# Patient Record
Sex: Male | Born: 1985 | Race: White | Hispanic: No | Marital: Single | Smoking: Never smoker
Health system: Southern US, Community
[De-identification: ages and names within clinical notes are randomized; demographics above are authoritative.]

---
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
## 2008-03-27 ENCOUNTER — Emergency Department (HOSPITAL_COMMUNITY): Admission: EM | Admit: 2008-03-27 | Discharge: 2008-03-27 | Payer: Self-pay | Admitting: Emergency Medicine

## 2010-06-23 LAB — POCT I-STAT, CHEM 8
BUN: 20 mg/dL (ref 6–23)
Hemoglobin: 17.3 g/dL — ABNORMAL HIGH (ref 13.0–17.0)
Potassium: 4.1 mEq/L (ref 3.5–5.1)
Sodium: 139 mEq/L (ref 135–145)
TCO2: 23 mmol/L (ref 0–100)

## 2012-05-18 ENCOUNTER — Emergency Department (HOSPITAL_COMMUNITY)
Admission: EM | Admit: 2012-05-18 | Discharge: 2012-05-18 | Disposition: A | Discharge status: Home / Self Care | Payer: No Typology Code available for payment source | Attending: Emergency Medicine | Admitting: Emergency Medicine

## 2012-05-18 ENCOUNTER — Emergency Department (HOSPITAL_COMMUNITY): Payer: No Typology Code available for payment source

## 2012-05-18 DIAGNOSIS — S8990XA Unspecified injury of unspecified lower leg, initial encounter: Secondary | ICD-10-CM | POA: Insufficient documentation

## 2012-05-18 DIAGNOSIS — Y9355 Activity, bike riding: Secondary | ICD-10-CM | POA: Insufficient documentation

## 2012-05-18 DIAGNOSIS — Y92410 Unspecified street and highway as the place of occurrence of the external cause: Secondary | ICD-10-CM | POA: Insufficient documentation

## 2012-05-18 MED ORDER — TETANUS-DIPHTH-ACELL PERTUSSIS 5-2.5-18.5 LF-MCG/0.5 IM SUSP
0.5000 mL | Freq: Once | INTRAMUSCULAR | Status: AC
  Administered 2012-05-18: 0.5 mL via INTRAMUSCULAR
  Filled 2012-05-18 (×2): qty 0.5

## 2012-05-18 MED ORDER — OXYCODONE-ACETAMINOPHEN 5-325 MG PO TABS: ORAL_TABLET | ORAL | Status: DC

## 2012-05-18 MED ORDER — ACETAMINOPHEN 325 MG PO TABS
650.0000 mg | ORAL_TABLET | Freq: Once | ORAL | Status: AC
Start: 2012-05-18 — End: 2012-05-18
  Administered 2012-05-18: 650 mg via ORAL
  Filled 2012-05-18: qty 2
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
## 2012-05-18 NOTE — ED Notes (Signed)
Patient transported to X-ray 
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
## 2012-05-23 NOTE — ED Provider Notes (Signed)
Medical screening examination/treatment/procedure(s) were performed by non-physician practitioner and as supervising physician I was immediately available for consultation/collaboration.  Gilda Crease, MD 05/23/12 2350

## 2015-04-04 ENCOUNTER — Ambulatory Visit (INDEPENDENT_AMBULATORY_CARE_PROVIDER_SITE_OTHER): Payer: Self-pay

## 2015-04-04 ENCOUNTER — Ambulatory Visit (INDEPENDENT_AMBULATORY_CARE_PROVIDER_SITE_OTHER): Payer: Self-pay | Admitting: Physician Assistant

## 2015-04-04 VITALS — BP 118/80 | HR 103 | Temp 99.5°F | Resp 18 | Ht 70.0 in | Wt 184.0 lb

## 2015-04-04 DIAGNOSIS — R05 Cough: Secondary | ICD-10-CM

## 2015-04-04 DIAGNOSIS — R059 Cough, unspecified: Secondary | ICD-10-CM

## 2015-04-04 DIAGNOSIS — R7989 Other specified abnormal findings of blood chemistry: Secondary | ICD-10-CM

## 2015-04-04 LAB — POCT CBC
Granulocyte percent: 74.6 %G (ref 37–80)
HCT, POC: 45.5 % (ref 43.5–53.7)
HEMOGLOBIN: 15.6 g/dL (ref 14.1–18.1)
Lymph, poc: 0.6 (ref 0.6–3.4)
MCH: 30.6 pg (ref 27–31.2)
MCHC: 34.2 g/dL (ref 31.8–35.4)
MCV: 89.4 fL (ref 80–97)
MID (cbc): 0.2 (ref 0–0.9)
MPV: 8.5 fL (ref 0–99.8)
PLATELET COUNT, POC: 97 10*3/uL — AB (ref 142–424)
POC Granulocyte: 5.4 (ref 2–6.9)
POC LYMPH PERCENT: 17.9 %L (ref 10–50)
POC MID %: 7.5 %M (ref 0–12)
RBC: 5.09 M/uL (ref 4.69–6.13)
RDW, POC: 12.4 %
WBC: 3.2 10*3/uL — AB (ref 4.6–10.2)

## 2015-04-04 NOTE — Patient Instructions (Addendum)
Because you received an x-ray today, you will receive an invoice from [facility]. Please contact [facility] at [phone] with questions or concerns regarding your invoice. Our billing staff will not be able to assist you with those questions.  Ibuprofen/tylenol for the pain.  You can take ibuprofen  every 6 hours.   Get plenty of rest. Gargle salt water.   Upper Respiratory Infection, Adult Most upper respiratory infections (URIs) are a viral infection of the air passages leading to the lungs. A URI affects the nose, throat, and upper air passages. The most common type of URI is nasopharyngitis and is typically referred to as "the common cold." URIs run their course and usually go away on their own. Most of the time, a URI does not require medical attention, but sometimes a bacterial infection in the upper airways can follow a viral infection. This is called a secondary infection. Sinus and middle ear infections are common types of secondary upper respiratory infections. Bacterial pneumonia can also complicate a URI. A URI can worsen asthma and chronic obstructive pulmonary disease (COPD). Sometimes, these complications can require emergency medical care and may be life threatening.  CAUSES Almost all URIs are caused by viruses. A virus is a type of germ and can spread from one person to another.  RISKS FACTORS You may be at risk for a URI if:   You smoke.   You have chronic heart or lung disease.  You have a weakened defense (immune) system.   You are very young or very old.   You have nasal allergies or asthma.  You work in crowded or poorly ventilated areas.  You work in health care facilities or schools. SIGNS AND SYMPTOMS  Symptoms typically develop 2-3 days after you come in contact with a cold virus. Most viral URIs last 7-10 days. However, viral URIs from the influenza virus (flu virus) can last 14-18 days and are typically more severe. Symptoms may  include:   Runny or stuffy (congested) nose.   Sneezing.   Cough.   Sore throat.   Headache.   Fatigue.   Fever.   Loss of appetite.   Pain in your forehead, behind your eyes, and over your cheekbones (sinus pain).  Muscle aches.  DIAGNOSIS  Your health care provider may diagnose a URI by:  Physical exam.  Tests to check that your symptoms are not due to another condition such as:  Strep throat.  Sinusitis.  Pneumonia.  Asthma. TREATMENT  A URI goes away on its own with time. It cannot be cured with medicines, but medicines may be prescribed or recommended to relieve symptoms. Medicines may help:  Reduce your fever.  Reduce your cough.  Relieve nasal congestion. HOME CARE INSTRUCTIONS   Take medicines only as directed by your health care provider.   Gargle warm saltwater or take cough drops to comfort your throat as directed by your health care provider.  Use a warm mist humidifier or inhale steam from a shower to increase air moisture. This may make it easier to breathe.  Drink enough fluid to keep your urine clear or pale yellow.   Eat soups and other clear broths and maintain good nutrition.   Rest as needed.   Return to work when your temperature has returned to normal or as your health care provider advises. You may need to stay home longer to avoid infecting others. You can also use a face mask and careful hand washing to prevent spread of the  virus.  Increase the usage of your inhaler if you have asthma.   Do not use any tobacco products, including cigarettes, chewing tobacco, or electronic cigarettes. If you need help quitting, ask your health care provider. PREVENTION  The best way to protect yourself from getting a cold is to practice good hygiene.   Avoid oral or hand contact with people with cold symptoms.   Wash your hands often if contact occurs.  There is no clear evidence that vitamin C, vitamin E, echinacea, or  exercise reduces the chance of developing a cold. However, it is always recommended to get plenty of rest, exercise, and practice good nutrition.  SEEK MEDICAL CARE IF:   You are getting worse rather than better.   Your symptoms are not controlled by medicine.   You have chills.  You have worsening shortness of breath.  You have brown or red mucus.  You have yellow or brown nasal discharge.  You have pain in your face, especially when you bend forward.  You have a fever.  You have swollen neck glands.  You have pain while swallowing.  You have white areas in the back of your throat. SEEK IMMEDIATE MEDICAL CARE IF:   You have severe or persistent:  Headache.  Ear pain.  Sinus pain.  Chest pain.  You have chronic lung disease and any of the following:  Wheezing.  Prolonged cough.  Coughing up blood.  A change in your usual mucus.  You have a stiff neck.  You have changes in your:  Vision.  Hearing.  Thinking.  Mood. MAKE SURE YOU:   Understand these instructions.  Will watch your condition.  Will get help right away if you are not doing well or get worse.   This information is not intended to replace advice given to you by your health care provider. Make sure you discuss any questions you have with your health care provider.   Document Released: 08/19/2000 Document Revised: 07/10/2014 Document Reviewed: 05/31/2013 Elsevier Interactive Patient Education [employer]

## 2015-04-04 NOTE — Progress Notes (Signed)
[facility] [address], [place] [address] [phone]  Date:  04/04/2015   Name:  Joshua Jennings   DOB:  1985/10/04   MRN:  [id number]  PCP:  Pcp Not In System    History of Present Illness:  Joshua Jennings is a 30 y.o. male patient who presents to [facility] for cc of cough. Patient reports that 2 days ago his sxs began with coughing that progressively worsened in the night.  He started taking mucinex for this which helped resolve the coughing.  He is left with body aches, sore throat, and mild nasal congestion.  Mild bilateral otaliga with swallowing, body aches, and sore throat, mild congestion.  Subjective fever and chills.  No nausea, abdominal pain, or fever.  He has taken mucinex, aleve, and ibuprofen.  He has not had a flu vaccine this year.  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  He denies sob or dyspnea.   There are no active problems to display for this patient.   History reviewed. No pertinent past medical history.  History reviewed. No pertinent past surgical history.  Social History  Substance Use Topics  . Smoking status: Never Smoker   . Smokeless tobacco: None  . Alcohol Use: No    History reviewed. No pertinent family history.  No Known Allergies  Medication list has been reviewed and updated.  Current Outpatient Prescriptions on File Prior to Visit  Medication Sig Dispense Refill  . Multiple Vitamin (MULTIVITAMIN WITH MINERALS) TABS Take 1 tablet by mouth daily. Reported on 04/04/2015    . oxyCODONE-acetaminophen (PERCOCET/ROXICET) 5-325 MG per tablet 1 to 2 tabs PO q6hrs  PRN for pain (Patient not taking: Reported on 04/04/2015) 15 tablet 0   No current facility-administered medications on file prior to visit.    ROS ROS otherwise unremarkable unless listed above.   Physical Examination: BP 118/80 mmHg  Pulse 103  Temp(Src) 99.5  F (37.5 C) (Oral)  Resp 18  Ht  (1.778 m)  Wt 184 lb (83.462 kg)  BMI 26.40 kg/m2  SpO2 97% Ideal Body Weight: Weight in (lb) to have BMI = 25: 173.9  Physical Exam  Constitutional: He is oriented to person, place, and time. He appears well-developed and well-nourished. No distress.  HENT:  Head: Normocephalic and atraumatic.  Right Ear: Tympanic membrane, external ear and ear canal normal.  Left Ear: Tympanic membrane, external ear and ear canal normal.  Nose: Mucosal edema and rhinorrhea (clear mucus) present. Right sinus exhibits no maxillary sinus tenderness and no frontal sinus tenderness. Left sinus exhibits no maxillary sinus tenderness and no frontal sinus tenderness.  Mouth/Throat: No uvula swelling. Posterior oropharyngeal erythema (minimal) present. No oropharyngeal exudate or posterior oropharyngeal edema.  Eyes: Conjunctivae, EOM and lids are normal. Pupils are equal, round, and reactive to light. Right eye exhibits normal extraocular motion. Left eye exhibits normal extraocular motion.  Neck: Trachea normal and full passive range of motion without pain. No edema and no erythema present.  Cardiovascular: Normal rate and regular rhythm.  Exam reveals no gallop and no friction rub.   No murmur heard. Pulmonary/Chest: Effort normal. No respiratory distress. He has no decreased breath sounds. He has no wheezes. He has no rhonchi.  Faint crackling at the lll.  Lymphadenopathy:       Head (right side): Tonsillar adenopathy present. No submental, no  submandibular, no preauricular and no posterior auricular adenopathy present.       Head (left side): No submental, no submandibular, no tonsillar, no preauricular and no posterior auricular adenopathy present.    He has no cervical adenopathy.  Neurological: He is alert and oriented to person, place, and time.  Skin: Skin is warm and dry. He is not diaphoretic.  Psychiatric: He has a normal mood and affect. His behavior is  normal.    Results for orders placed or performed in visit on 04/04/15  POCT CBC  Result Value Ref Range   WBC 3.2 (A) 4.6 - 10.2 K/uL   Lymph, poc 0.6 0.6 - 3.4   POC LYMPH PERCENT 17.9 10 - 50 %L   MID (cbc) 0.2 0 - 0.9   POC MID % 7.5 0 - 12 %M   POC Granulocyte 5.4 2 - 6.9   Granulocyte percent 74.6 37 - 80 %G   RBC 5.09 4.69 - 6.13 M/uL   Hemoglobin 15.6 14.1 - 18.1 g/dL   HCT, POC 16.1 09.6 - 53.7 %   MCV 89.4 80 - 97 fL   MCH, POC 30.6 27 - 31.2 pg   MCHC 34.2 31.8 - 35.4 g/dL   RDW, POC 04.5 %   Platelet Count, POC 97 (A) 142 - 424 K/uL   MPV 8.5 0 - 99.8 fL   Orthostatic VS for the past 24 hrs (Last 3 readings):  BP- Lying Pulse- Lying BP- Sitting Pulse- Sitting BP- Standing at 0 minutes Pulse- Standing at 0 minutes  04/04/15 1332 123/79 mmHg 93 131/79 mmHg 105 116/77 mmHg 109  Dg Chest 2 View  04/04/2015  CLINICAL DATA:  Cough. EXAM: CHEST  2 VIEW COMPARISON:  05/18/2012. FINDINGS: Mediastinum hilar structures normal. Lungs are clear. Heart size normal. No pleural effusion or pneumothorax. No acute bony abnormality. IMPRESSION: No acute cardiopulmonary disease. Electronically Signed   By: Maisie Fus  Register   On: 04/04/2015 14:45   Assessment and Plan: Joshua Jennings is a 30 y.o. male who is here today for chief complaint of nasal congestion, body aches, sore throat, cough, and fatigue.   Likely viral--and this could be flu.  i have advised heavy hydration.  Continued use of mucinex, and tylenol/ibuprofen for pain/fever.  Patient voiced understanding.  He will also return in 1-2 weeks for recheck of his platelets.    Cough - Plan: POCT CBC, DG Chest 2 View  Abnormal complete blood count - Plan: CBC with Differential/Platelet  Trena Platt, PA-C [facility] 04/04/2015 1:17 PM

## 2015-04-07 ENCOUNTER — Encounter: Payer: Self-pay | Admitting: Physician Assistant

## 2015-05-10 ENCOUNTER — Encounter: Payer: Self-pay | Admitting: Internal Medicine

## 2015-05-10 MED ORDER — DOXYCYCLINE HYCLATE 100 MG PO TABS: 100.0000 mg | ORAL_TABLET | Freq: Two times a day (BID) | ORAL | Status: DC

## 2015-05-13 NOTE — Progress Notes (Signed)
Left without being seen.   This encounter was created in error - please disregard.

## 2015-05-14 NOTE — Progress Notes (Signed)
I have cancelled the doxy Rx at pharm.

## 2015-11-14 ENCOUNTER — Ambulatory Visit (INDEPENDENT_AMBULATORY_CARE_PROVIDER_SITE_OTHER): Payer: Self-pay | Admitting: Urgent Care

## 2015-11-14 VITALS — BP 110/70 | HR 77 | Temp 98.1°F | Resp 18 | Ht 70.5 in | Wt 168.0 lb

## 2015-11-14 DIAGNOSIS — R197 Diarrhea, unspecified: Secondary | ICD-10-CM

## 2015-11-14 DIAGNOSIS — R112 Nausea with vomiting, unspecified: Secondary | ICD-10-CM

## 2015-11-14 DIAGNOSIS — R109 Unspecified abdominal pain: Secondary | ICD-10-CM

## 2015-11-14 LAB — POCT CBC
GRANULOCYTE PERCENT: 76.6 % (ref 37–80)
HCT, POC: 37.3 % — AB (ref 43.5–53.7)
Hemoglobin: 12.8 g/dL — AB (ref 14.1–18.1)
Lymph, poc: 1.7 (ref 0.6–3.4)
MCH, POC: 29.5 pg (ref 27–31.2)
MCHC: 34.4 g/dL (ref 31.8–35.4)
MCV: 85.9 fL (ref 80–97)
MID (cbc): 0.8 (ref 0–0.9)
MPV: 7.4 fL (ref 0–99.8)
PLATELET COUNT, POC: 242 10*3/uL (ref 142–424)
POC Granulocyte: 8.3 — AB (ref 2–6.9)
POC LYMPH %: 16 % (ref 10–50)
POC MID %: 7.4 %M (ref 0–12)
RBC: 4.34 M/uL — AB (ref 4.69–6.13)
RDW, POC: 11.9 %
WBC: 10.9 10*3/uL — AB (ref 4.6–10.2)

## 2015-11-14 LAB — POCT URINALYSIS DIP (MANUAL ENTRY)
BILIRUBIN UA: NEGATIVE
BILIRUBIN UA: NEGATIVE
Glucose, UA: NEGATIVE
LEUKOCYTES UA: NEGATIVE
Nitrite, UA: NEGATIVE
PH UA: 6
SPEC GRAV UA: 1.015
Urobilinogen, UA: 0.2

## 2015-11-14 NOTE — Patient Instructions (Addendum)
Diarrhea Diarrhea is frequent loose and watery bowel movements. It can cause you to feel weak and dehydrated. Dehydration can cause you to become tired and thirsty, have a dry mouth, and have decreased urination that often is dark yellow. Diarrhea is a sign of another problem, most often an infection that will not last long. In most cases, diarrhea typically lasts 2-3 days. However, it can last longer if it is a sign of something more serious. It is important to treat your diarrhea as directed by your caregiver to lessen or prevent future episodes of diarrhea. CAUSES  Some common causes include:  Gastrointestinal infections caused by viruses, bacteria, or parasites.  Food poisoning or food allergies.  Certain medicines, such as antibiotics, chemotherapy, and laxatives.  Artificial sweeteners and fructose.  Digestive disorders. HOME CARE INSTRUCTIONS  Ensure adequate fluid intake (hydration): Have 1 cup (8 oz) of fluid for each diarrhea episode. Avoid fluids that contain simple sugars or sports drinks, fruit juices, whole milk products, and sodas. Your urine should be clear or pale yellow if you are drinking enough fluids. Hydrate with an oral rehydration solution that you can purchase at pharmacies, retail stores, and online. You can prepare an oral rehydration solution at home by mixing the following ingredients together:   - tsp table salt.   tsp baking soda.   tsp salt substitute containing potassium chloride.  1  tablespoons sugar.  1 L (34 oz) of water.  Certain foods and beverages may increase the speed at which food moves through the gastrointestinal (GI) tract. These foods and beverages should be avoided and include:  Caffeinated and alcoholic beverages.  High-fiber foods, such as raw fruits and vegetables, nuts, seeds, and whole grain breads and cereals.  Foods and beverages sweetened with sugar alcohols, such as xylitol, sorbitol, and mannitol.  Some foods may be well  tolerated and may help thicken stool including:  Starchy foods, such as rice, toast, pasta, low-sugar cereal, oatmeal, grits, baked potatoes, crackers, and bagels.  Bananas.  Applesauce.  Add probiotic-rich foods to help increase healthy bacteria in the GI tract, such as yogurt and fermented milk products.  Wash your hands well after each diarrhea episode.  Only take over-the-counter or prescription medicines as directed by your caregiver.  Take a warm bath to relieve any burning or pain from frequent diarrhea episodes. SEEK IMMEDIATE MEDICAL CARE IF:   You are unable to keep fluids down.  You have persistent vomiting.  You have blood in your stool, or your stools are black and tarry.  You do not urinate in 6-8 hours, or there is only a small amount of very dark urine.  You have abdominal pain that increases or localizes.  You have weakness, dizziness, confusion, or light-headedness.  You have a severe headache.  Your diarrhea gets worse or does not get better.  You have a fever or persistent symptoms for more than 2-3 days.  You have a fever and your symptoms suddenly get worse. MAKE SURE YOU:   Understand these instructions.  Will watch your condition.  Will get help right away if you are not doing well or get worse.   This information is not intended to replace advice given to you by your health care provider. Make sure you discuss any questions you have with your health care provider.   Document Released: 02/13/2002 Document Revised: 03/16/2014 Document Reviewed: 11/01/2011 Elsevier Interactive Patient Education 2016 [employer] Inc[employer]     IF you received an x-ray today, you will   receive an invoice from [facility]. Please contact [facility] at [phone] with questions or concerns regarding your invoice.   IF you received labwork today, you will receive an invoice from [facility]. Please contact [facility] at  [phone] with questions or concerns regarding your invoice.   Our billing staff will not be able to assist you with questions regarding bills from these companies.  You will be contacted with the lab results as soon as they are available. The fastest way to get your results is to activate your My Chart account. Instructions are located on the last page of this paperwork. If you have not heard from us regarding the results in 2 weeks, please contact this office.      

## 2015-11-14 NOTE — Progress Notes (Signed)
MRN: [id number] DOB: 1985/12/12  Subjective:   Joshua Jennings is a 30 y.o. male presenting for chief complaint of Other (Possible stomach virus for the past 12 days. Diarrhea, flu like symptoms. )  Reports 2 week history of diarrhea. He has been having 10 bowel movements per day. Has also had nausea and vomiting (2 episodes), belly cramping, chills, subjective fever. There is slight improvement in nausea and vomiting. He did start to eat more fiber, has hydrated well over the past 5 days. He still has diarrhea but is 2-3 times daily. He has had episodes of this in the past few years, symptoms have resolved on their own. He has never seen a GI specialist. Denies eating uncooked food, drinking unfiltered water, bloody stools. Patient has generally kept his normal eating routine.  Joshua Jennings has a current medication list which includes the following prescription(s): multivitamin with minerals and oxycodone-acetaminophen. Also has No Known Allergies.  Joshua Jennings  has no past medical history on file. Also  has no past surgical history on file.  Objective:   Vitals: BP 110/70   Pulse 77   Temp 98.1 F (36.7 C) (Oral)   Resp 18   Ht 5' 10.5" (1.791 m)   Wt 168 lb (76.2 kg)   SpO2 100%   BMI 23.76 kg/m   Physical Exam  Constitutional: He is oriented to person, place, and time. He appears well-developed and well-nourished.  HENT:  Mouth/Throat: Oropharynx is clear and moist.  Cardiovascular: Normal rate, regular rhythm and intact distal pulses.  Exam reveals no gallop and no friction rub.   No murmur heard. Pulmonary/Chest: No respiratory distress. He has no wheezes. He has no rales.  Abdominal: Soft. Bowel sounds are normal. He exhibits no distension and no mass. There is no guarding.  Neurological: He is alert and oriented to person, place, and time.  Skin: Skin is warm and dry.   Results for orders placed or performed in visit on 11/14/15 (from the past 24 hour(s))  POCT urinalysis  dipstick     Status: Abnormal   Collection Time: 11/14/15 12:53 PM  Result Value Ref Range   Color, UA yellow yellow   Clarity, UA clear clear   Glucose, UA negative negative   Bilirubin, UA negative negative   Ketones, POC UA negative negative   Spec Grav, UA 1.015    Blood, UA small (A) negative   pH, UA 6.0    Protein Ur, POC trace (A) negative   Urobilinogen, UA 0.2    Nitrite, UA Negative Negative   Leukocytes, UA Negative Negative  POCT CBC     Status: Abnormal   Collection Time: 11/14/15  1:03 PM  Result Value Ref Range   WBC 10.9 (A) 4.6 - 10.2 K/uL   Lymph, poc 1.7 0.6 - 3.4   POC LYMPH PERCENT 16.0 10 - 50 %L   MID (cbc) 0.8 0 - 0.9   POC MID % 7.4 0 - 12 %M   POC Granulocyte 8.3 (A) 2 - 6.9   Granulocyte percent 76.6 37 - 80 %G   RBC 4.34 (A) 4.69 - 6.13 M/uL   Hemoglobin 12.8 (A) 14.1 - 18.1 g/dL   HCT, POC 40.9 (A) 81.1 - 53.7 %   MCV 85.9 80 - 97 fL   MCH, POC 29.5 27 - 31.2 pg   MCHC 34.4 31.8 - 35.4 g/dL   RDW, POC 91.4 %   Platelet Count, POC 242 142 - 424 K/uL   MPV  7.4 0 - 99.8 fL   Assessment and Plan :   1. Diarrhea, unspecified type 2. Nausea and vomiting, intractability of vomiting not specified, unspecified vomiting type 3. Abdominal cramping - GI panel is pending, I do suspect an infectious etiology. I advised patient start supportive care. Counseled on signs and symptoms warranting more emergent treatment.  Joshua Papillion, PA-C [facility] [phone] 11/14/2015 12:30 PM

## 2015-11-15 LAB — GASTROINTESTINAL PATHOGEN PANEL PCR
C. difficile Tox A/B, PCR: NOT DETECTED
CAMPYLOBACTER, PCR: NOT DETECTED
Cryptosporidium, PCR: NOT DETECTED
E coli (ETEC) LT/ST PCR: NOT DETECTED
E coli (STEC) stx1/stx2, PCR: NOT DETECTED
E coli 0157, PCR: NOT DETECTED
GIARDIA LAMBLIA, PCR: NOT DETECTED
NOROVIRUS, PCR: NOT DETECTED
ROTAVIRUS, PCR: NOT DETECTED
SALMONELLA, PCR: NOT DETECTED
SHIGELLA, PCR: NOT DETECTED

## 2015-11-18 ENCOUNTER — Telehealth: Payer: Self-pay

## 2015-11-18 NOTE — Telephone Encounter (Signed)
Patient would like a phone call once labs have been reviewed. [phone]

## 2015-11-20 NOTE — Telephone Encounter (Signed)
Already done

## 2015-11-21 ENCOUNTER — Other Ambulatory Visit (INDEPENDENT_AMBULATORY_CARE_PROVIDER_SITE_OTHER): Payer: Self-pay

## 2015-11-21 ENCOUNTER — Ambulatory Visit (INDEPENDENT_AMBULATORY_CARE_PROVIDER_SITE_OTHER): Payer: Self-pay | Admitting: Internal Medicine

## 2015-11-21 ENCOUNTER — Encounter: Payer: Self-pay | Admitting: Internal Medicine

## 2015-11-21 DIAGNOSIS — R112 Nausea with vomiting, unspecified: Secondary | ICD-10-CM | POA: Insufficient documentation

## 2015-11-21 DIAGNOSIS — A09 Infectious gastroenteritis and colitis, unspecified: Secondary | ICD-10-CM

## 2015-11-21 DIAGNOSIS — R197 Diarrhea, unspecified: Secondary | ICD-10-CM

## 2015-11-21 DIAGNOSIS — D62 Acute posthemorrhagic anemia: Secondary | ICD-10-CM

## 2015-11-21 LAB — HEPATIC FUNCTION PANEL
ALT: 11 U/L (ref 0–53)
AST: 13 U/L (ref 0–37)
Albumin: 3.1 g/dL — ABNORMAL LOW (ref 3.5–5.2)
Alkaline Phosphatase: 61 U/L (ref 39–117)
BILIRUBIN DIRECT: 0.1 mg/dL (ref 0.0–0.3)
BILIRUBIN TOTAL: 0.3 mg/dL (ref 0.2–1.2)
TOTAL PROTEIN: 6.5 g/dL (ref 6.0–8.3)

## 2015-11-21 LAB — BASIC METABOLIC PANEL
BUN: 11 mg/dL (ref 6–23)
CALCIUM: 8.7 mg/dL (ref 8.4–10.5)
CO2: 33 meq/L — AB (ref 19–32)
CREATININE: 1.11 mg/dL (ref 0.40–1.50)
Chloride: 103 mEq/L (ref 96–112)
GFR: 83.11 mL/min (ref >60.00)
GLUCOSE: 119 mg/dL — AB (ref 70–99)
Potassium: 4.7 mEq/L (ref 3.5–5.1)
SODIUM: 138 meq/L (ref 135–145)

## 2015-11-21 LAB — CBC WITH DIFFERENTIAL/PLATELET
BASOS PCT: 0.5 % (ref 0.0–3.0)
Basophils Absolute: 0 10*3/uL (ref 0.0–0.1)
EOS PCT: 2.6 % (ref 0.0–5.0)
Eosinophils Absolute: 0.2 10*3/uL (ref 0.0–0.7)
HCT: 33.2 % — ABNORMAL LOW (ref 39.0–52.0)
HEMOGLOBIN: 11.1 g/dL — AB (ref 13.0–17.0)
Lymphocytes Relative: 14.1 % (ref 12.0–46.0)
Lymphs Abs: 1.3 10*3/uL (ref 0.7–4.0)
MCHC: 33.4 g/dL (ref 30.0–36.0)
MCV: 83.8 fl (ref 78.0–100.0)
MONO ABS: 0.9 10*3/uL (ref 0.1–1.0)
MONOS PCT: 9.8 % (ref 3.0–12.0)
Neutro Abs: 6.7 10*3/uL (ref 1.4–7.7)
Neutrophils Relative %: 73 % (ref 43.0–77.0)
Platelets: 348 10*3/uL (ref 150.0–400.0)
RBC: 3.96 Mil/uL — AB (ref 4.22–5.81)
RDW: 12.5 % (ref 11.5–15.5)
WBC: 9.1 10*3/uL (ref 4.0–10.5)

## 2015-11-21 LAB — SEDIMENTATION RATE: Sed Rate: 33 mm/hr — ABNORMAL HIGH (ref 0–15)

## 2015-11-21 LAB — URINALYSIS, ROUTINE W REFLEX MICROSCOPIC
Bilirubin Urine: NEGATIVE
KETONES UR: NEGATIVE
Leukocytes, UA: NEGATIVE
Nitrite: NEGATIVE
SPECIFIC GRAVITY, URINE: 1.02 (ref 1.000–1.030)
Total Protein, Urine: NEGATIVE
URINE GLUCOSE: NEGATIVE
UROBILINOGEN UA: 0.2 (ref 0.0–1.0)
pH: 6 (ref 5.0–8.0)

## 2015-11-21 MED ORDER — DIPHENOXYLATE-ATROPINE 2.5-0.025 MG PO TABS: 1.0000 | ORAL_TABLET | Freq: Four times a day (QID) | ORAL | 0 refills | Status: AC | PRN

## 2015-11-21 MED ORDER — FAMOTIDINE 40 MG PO TABS: 40.0000 mg | ORAL_TABLET | Freq: Every day | ORAL | 1 refills | Status: AC

## 2015-11-21 MED ORDER — PROMETHAZINE HCL 12.5 MG PO TABS: 12.5000 mg | ORAL_TABLET | Freq: Four times a day (QID) | ORAL | 1 refills | Status: AC | PRN
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
## 2015-11-21 NOTE — Assessment & Plan Note (Signed)
Labs GI ref - appt tomorrow

## 2015-11-21 NOTE — Assessment & Plan Note (Addendum)
Recurrent x3 in the past 18 months R/o IBD - ?Crohn's vs other  GI ref ASAP - appt w/Jessica tomorrow at 2 pm Labs Lomotil, Promethazine, hyoscyamine prn

## 2015-11-21 NOTE — Progress Notes (Signed)
Pre visit review using our clinic review tool, if applicable. No additional management support is needed unless otherwise documented below in the visit note. 

## 2015-11-21 NOTE — Assessment & Plan Note (Signed)
Labs Prometazine prn, Pepcid

## 2015-11-22 ENCOUNTER — Ambulatory Visit (INDEPENDENT_AMBULATORY_CARE_PROVIDER_SITE_OTHER): Payer: Self-pay | Admitting: Gastroenterology

## 2015-11-22 ENCOUNTER — Encounter: Payer: Self-pay | Admitting: Gastroenterology

## 2015-11-22 VITALS — BP 100/50 | HR 88 | Ht 69.0 in | Wt 161.0 lb

## 2015-11-22 DIAGNOSIS — D649 Anemia, unspecified: Secondary | ICD-10-CM | POA: Insufficient documentation

## 2015-11-22 DIAGNOSIS — K625 Hemorrhage of anus and rectum: Secondary | ICD-10-CM | POA: Insufficient documentation

## 2015-11-22 DIAGNOSIS — K591 Functional diarrhea: Secondary | ICD-10-CM

## 2015-11-22 NOTE — Patient Instructions (Signed)
You have been scheduled for a colonoscopy. Please follow written instructions given to you at your visit today.  Please pick up your prep supplies at the pharmacy within the next 1-3 days. If you use inhalers (even only as needed), please bring them with you on the day of your procedure.   

## 2015-11-22 NOTE — Progress Notes (Signed)
     11/22/2015 Joshua FifeRobert Jennings [id number] 1985-11-22   HISTORY OF PRESENT ILLNESS:  This is a 30 year old male who is new to our practice and has been referred here by his PCP, Dr. Posey ReaPlotnikov, for evaluation of diarrhea and rectal bleeding.  He tells me that for the past 20 days he's had diarrhea multiple times a day. He says that on a good day he goes 7-8 times but on a bad day he goes upwards of 10 times per day. He has several nocturnal bowel movements. Sees blood about 50% of the time with stools since this has been present.  He went to urgent care and stool GI pathogen panel was negative.  He then just saw his PCP yesterday at which time his hemoglobin was found to be 11.1 g. Albumin is slightly low at 3.1. Sedimentation rate elevated at 33. He tells me that over the past 2 years this is the third time that he's had diarrheal type episodes, but the other 2 times it was just diarrhea and rectal bleeding and the symptoms resolved after just several days. On this occasion he also had severe nausea, night sweats, weight loss, etc.   History reviewed. No pertinent past medical history. History reviewed. No pertinent surgical history.  reports that he has never smoked. He has never used smokeless tobacco. He reports that he drinks alcohol. He reports that he uses drugs, including Marijuana, about 1 time per week. family history includes Arthritis in his maternal grandmother. No Known Allergies    Outpatient Encounter Prescriptions as of 11/22/2015  Medication Sig  . diphenoxylate-atropine (LOMOTIL) 2.5-0.025 MG tablet Take 1 tablet by mouth 4 (four) times daily as needed for diarrhea or loose stools.  . famotidine (PEPCID) 40 MG tablet Take 1 tablet (40 mg total) by mouth daily.  Marland Kitchen. ibuprofen (ADVIL,MOTRIN) 200 MG tablet Take 400 mg by mouth every 6 (six) hours as needed.  . promethazine (PHENERGAN) 12.5 MG tablet Take 1-2 tablets (12.5-25 mg total) by mouth every 6 (six) hours as needed for nausea  or vomiting.   No facility-administered encounter medications on file as of 11/22/2015.      REVIEW OF SYSTEMS  : All other systems reviewed and negative except where noted in the History of Present Illness.   PHYSICAL EXAM: BP (!) 100/50   Pulse 88   Ht 5\' 9"  (1.753 m)   Wt 161 lb (73 kg)   BMI 23.78 kg/m  General: Well developed white male in no acute distress Head: Normocephalic and atraumatic Eyes:  Sclerae anicteric, conjunctiva pink. Ears: Normal auditory acuity Lungs: Clear throughout to auscultation Heart: Regular rate and rhythm Abdomen: Soft, non-distended.  Normal bowel sounds.  Non-tender. Rectal:  Will be done at the time of colonoscopy. Musculoskeletal: Symmetrical with no gross deformities  Skin: No lesions on visible extremities.  Multiple tattoos noted. Extremities: No edema  Neurological: Alert oriented x 4, grossly non-focal Psychological:  Alert and cooperative. Normal mood and affect  ASSESSMENT AND PLAN: -30 year old male with diarrhea and rectal bleeding. Present for the past 20 days but has had a couple other instances of similar symptoms over the past 2 years. Stool studies negative. Sedimentation rate elevated. Hemoglobin also slightly low.  We'll schedule for colonoscopy to rule out IBD.    *The risks, benefits, and alternatives to colonoscopy were discussed with the patient and he consents to proceed.   CC:  Joshua Jennings, Joshua QuintAleksei V, MD

## 2015-11-22 NOTE — Progress Notes (Signed)
Reviewed and agree with management plan.  Koden Hunzeker T. Charlis Harner, MD FACG 

## 2015-11-28 ENCOUNTER — Telehealth: Payer: Self-pay | Admitting: Gastroenterology

## 2015-11-29 NOTE — Telephone Encounter (Signed)
Left message for patient to call back  

## 2015-11-29 NOTE — Telephone Encounter (Signed)
Sheri,  See office note. Per Care Everywhere he had a flex sig [facility]. He was scheduled for colonoscopy with me which he cancelled today.  Please contact him and find out where he plans to receive his ongoing GI care. If he plans to see us we need to get flex sig and biopsy records. If he plans to have follow up elsewhere please note that in the record.  Yes charge for late cancellation

## 2015-12-02 ENCOUNTER — Encounter: Payer: Self-pay | Admitting: Gastroenterology

## 2015-12-02 NOTE — Telephone Encounter (Signed)
Plans noted that he decided to have ongoing GI care [facility].

## 2015-12-02 NOTE — Telephone Encounter (Signed)
I spoke with the patient and he was inpatient and was discharged late on Wed.  He states his symptoms got much worse and was [facility].  He will keep his follow up appt with them.

## 2015-12-02 NOTE — Telephone Encounter (Signed)
Left message for patient to call back  

## 2015-12-10 ENCOUNTER — Ambulatory Visit: Payer: Self-pay | Admitting: Internal Medicine

## 2017-07-02 IMAGING — CR DG CHEST 2V
2 series · 2 of 2 positions shown · non-contrast
Comparison: 05/18/2012.

CLINICAL DATA: Cough.

EXAM:
CHEST  2 VIEW

[PA]
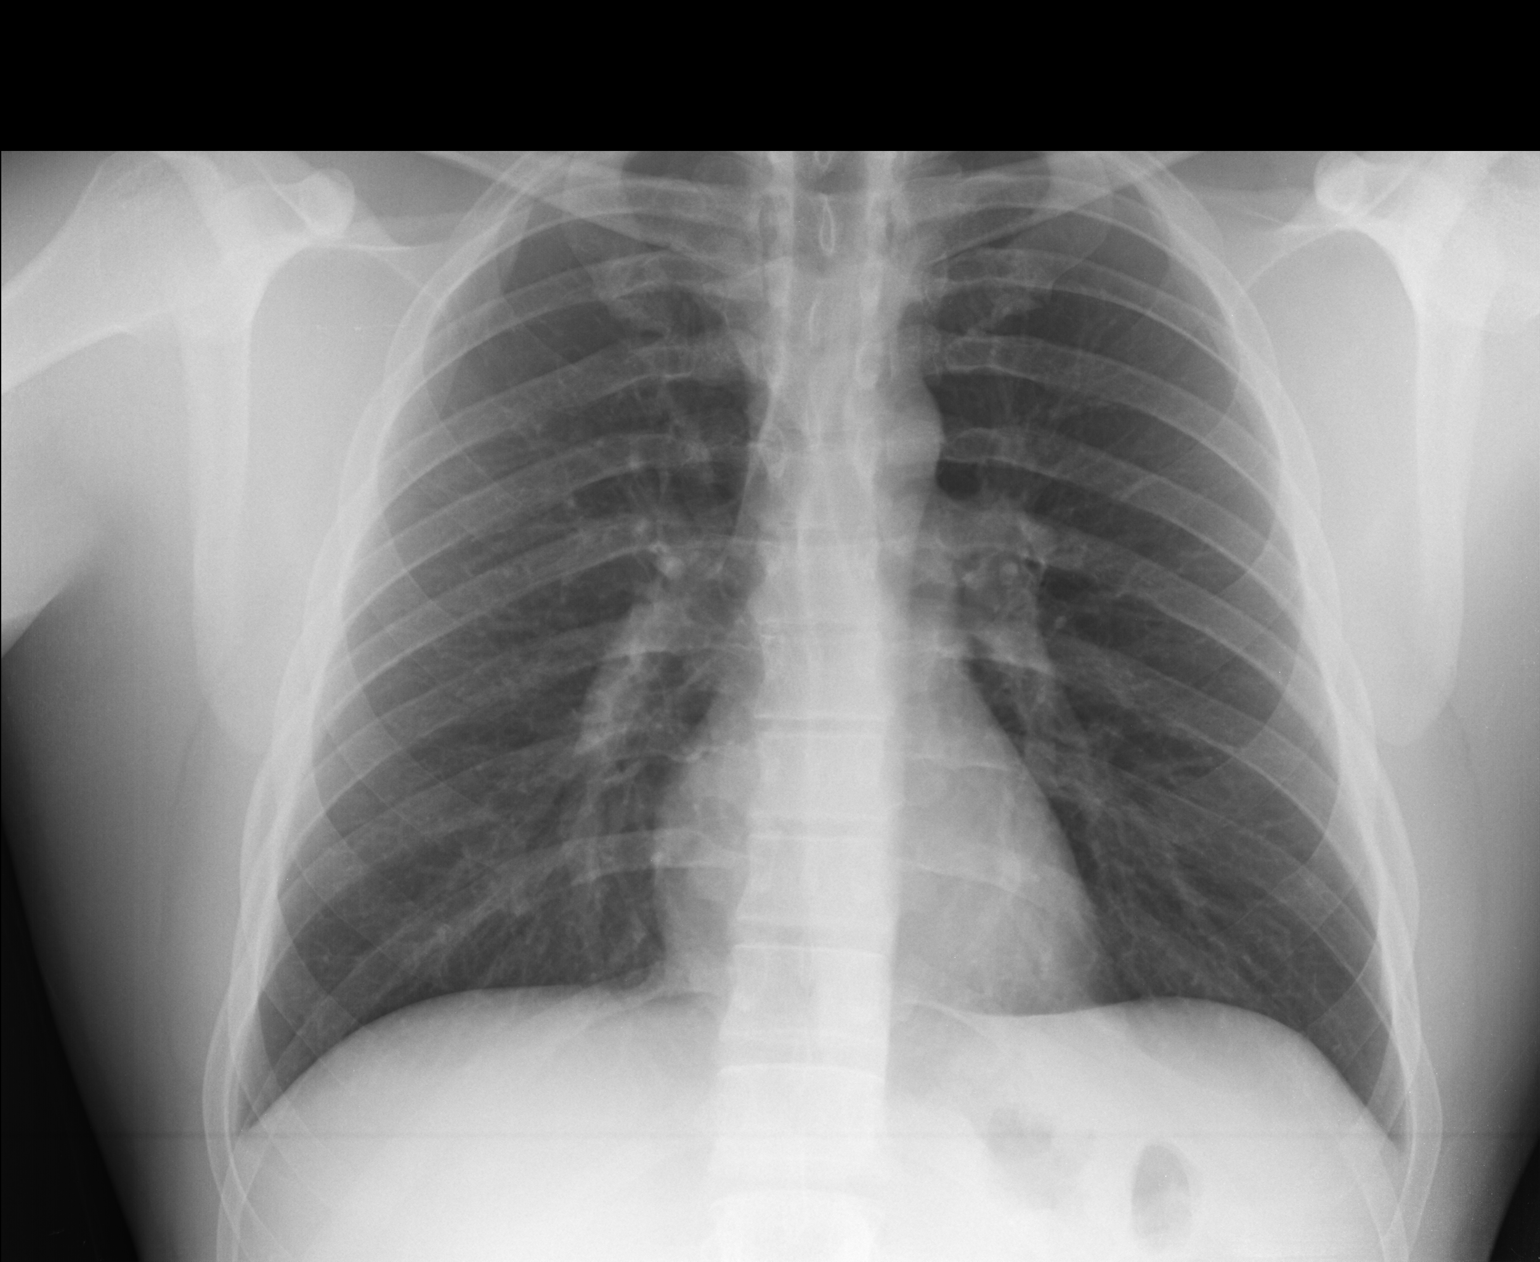

[lateral]
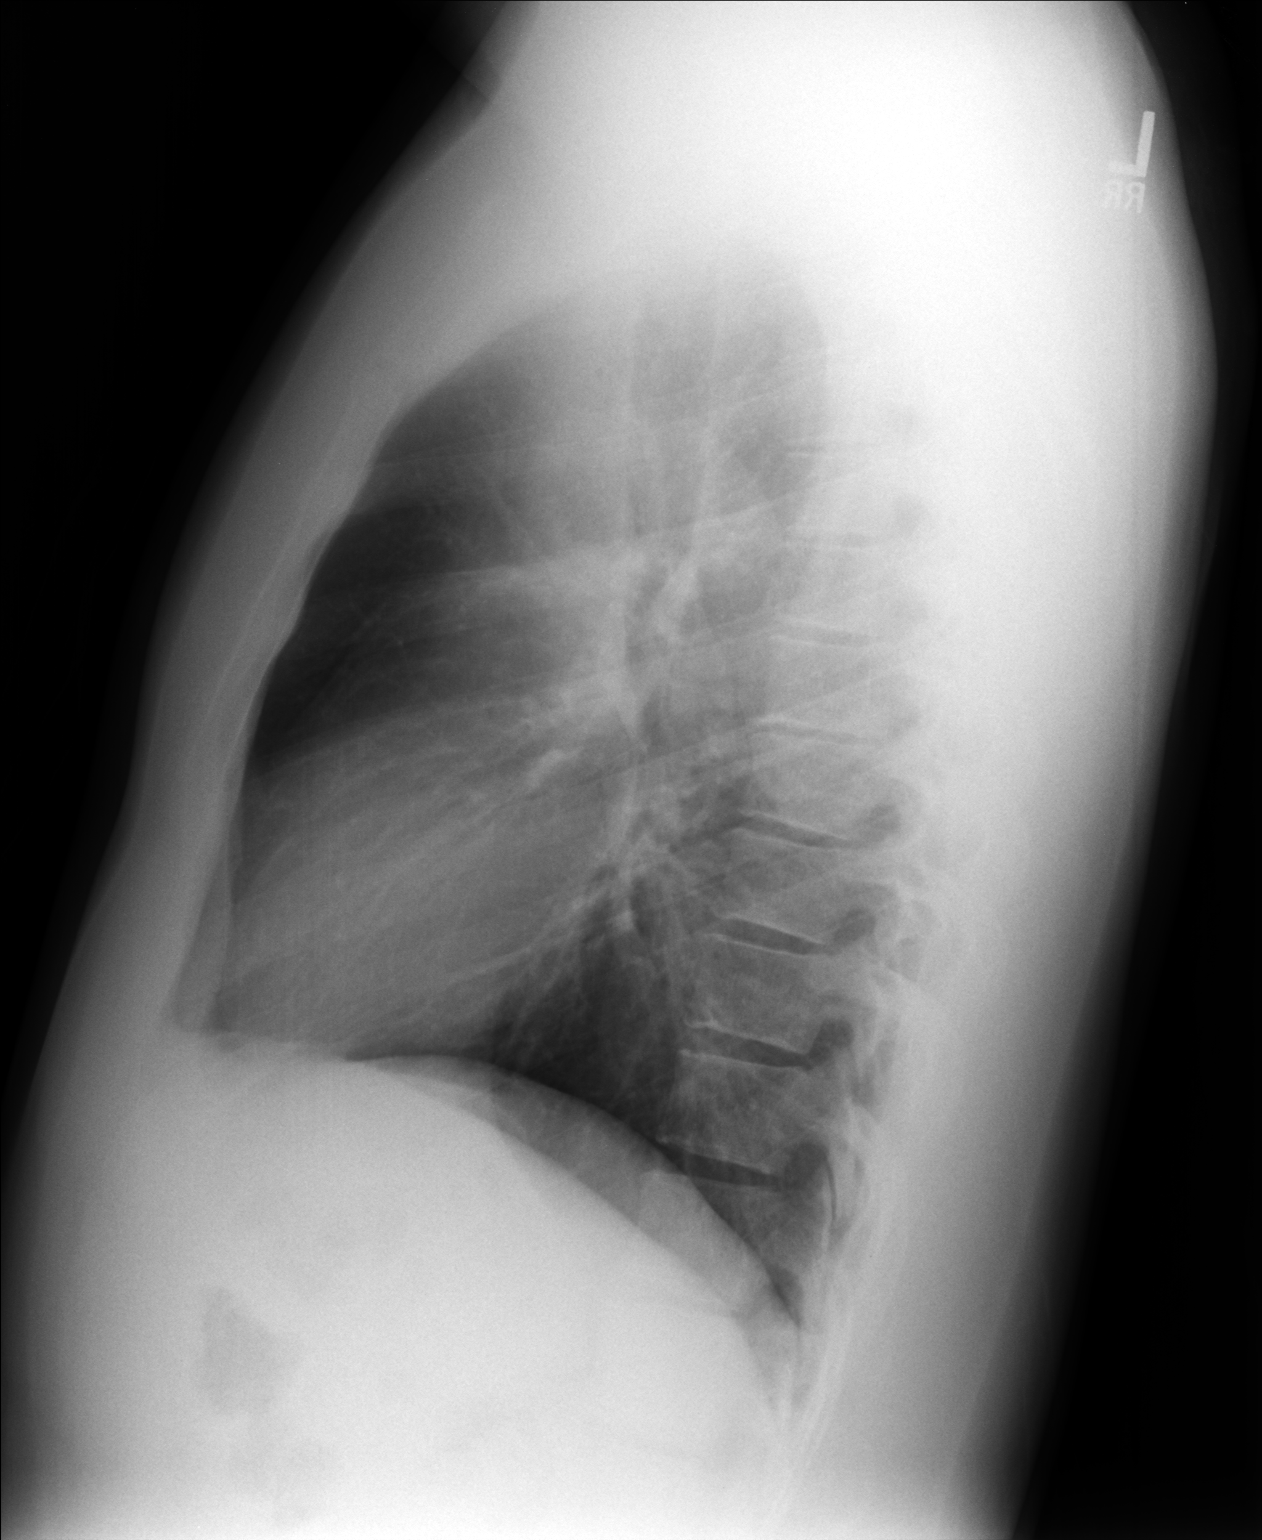

[2 of 2 positions shown; findings below may reference images not displayed]

FINDINGS: Mediastinum hilar structures normal. Lungs are clear. Heart size
normal. No pleural effusion or pneumothorax. No acute bony
abnormality.
IMPRESSION: No acute cardiopulmonary disease.

## 2019-02-01 ENCOUNTER — Other Ambulatory Visit: Payer: Self-pay

## 2019-02-01 DIAGNOSIS — Z20822 Contact with and (suspected) exposure to covid-19: Secondary | ICD-10-CM

## 2019-02-02 LAB — NOVEL CORONAVIRUS, NAA: SARS-CoV-2, NAA: NOT DETECTED
